# Patient Record
Sex: Male | Born: 1945 | Race: Black or African American | Hispanic: No | Smoking: Current every day smoker
Health system: Southern US, Community
[De-identification: ages and names within clinical notes are randomized; demographics above are authoritative.]

---

## 2016-09-04 ENCOUNTER — Encounter (HOSPITAL_BASED_OUTPATIENT_CLINIC_OR_DEPARTMENT_OTHER): Payer: Self-pay | Admitting: *Deleted

## 2016-09-04 ENCOUNTER — Emergency Department (HOSPITAL_BASED_OUTPATIENT_CLINIC_OR_DEPARTMENT_OTHER)
Admission: EM | Admit: 2016-09-04 | Discharge: 2016-09-04 | Disposition: A | Discharge status: Home / Self Care | Payer: No Typology Code available for payment source | Attending: Emergency Medicine | Admitting: Emergency Medicine

## 2016-09-04 ENCOUNTER — Emergency Department (HOSPITAL_BASED_OUTPATIENT_CLINIC_OR_DEPARTMENT_OTHER): Payer: No Typology Code available for payment source

## 2016-09-04 DIAGNOSIS — Y92410 Unspecified street and highway as the place of occurrence of the external cause: Secondary | ICD-10-CM | POA: Diagnosis not present

## 2016-09-04 DIAGNOSIS — F17210 Nicotine dependence, cigarettes, uncomplicated: Secondary | ICD-10-CM | POA: Diagnosis not present

## 2016-09-04 DIAGNOSIS — Y999 Unspecified external cause status: Secondary | ICD-10-CM | POA: Insufficient documentation

## 2016-09-04 DIAGNOSIS — S161XXA Strain of muscle, fascia and tendon at neck level, initial encounter: Secondary | ICD-10-CM | POA: Insufficient documentation

## 2016-09-04 DIAGNOSIS — R51 Headache: Secondary | ICD-10-CM | POA: Insufficient documentation

## 2016-09-04 DIAGNOSIS — Y9389 Activity, other specified: Secondary | ICD-10-CM | POA: Insufficient documentation

## 2016-09-04 DIAGNOSIS — S199XXA Unspecified injury of neck, initial encounter: Secondary | ICD-10-CM | POA: Diagnosis present
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
